# Patient Record
Sex: Male | Born: 1940 | Smoking: Never smoker
Health system: Southern US, Community
[De-identification: ages and names within clinical notes are randomized; demographics above are authoritative.]

## PROBLEM LIST (undated history)

## (undated) DIAGNOSIS — M1990 Unspecified osteoarthritis, unspecified site: Secondary | ICD-10-CM

## (undated) DIAGNOSIS — I10 Essential (primary) hypertension: Secondary | ICD-10-CM

## (undated) DIAGNOSIS — I499 Cardiac arrhythmia, unspecified: Secondary | ICD-10-CM

## (undated) HISTORY — PX: THYROID SURGERY: SHX805

## (undated) HISTORY — PX: CHOLECYSTECTOMY: SHX55

## (undated) HISTORY — PX: COLONOSCOPY: SHX174

---

## 2011-06-11 DIAGNOSIS — Z9089 Acquired absence of other organs: Secondary | ICD-10-CM | POA: Insufficient documentation

## 2011-06-11 DIAGNOSIS — N400 Enlarged prostate without lower urinary tract symptoms: Secondary | ICD-10-CM | POA: Insufficient documentation

## 2011-06-11 DIAGNOSIS — E039 Hypothyroidism, unspecified: Secondary | ICD-10-CM | POA: Insufficient documentation

## 2011-06-11 DIAGNOSIS — I10 Essential (primary) hypertension: Secondary | ICD-10-CM | POA: Insufficient documentation

## 2013-10-01 DIAGNOSIS — I509 Heart failure, unspecified: Secondary | ICD-10-CM | POA: Insufficient documentation

## 2018-05-29 DIAGNOSIS — R29898 Other symptoms and signs involving the musculoskeletal system: Secondary | ICD-10-CM | POA: Insufficient documentation

## 2019-06-18 DIAGNOSIS — F5104 Psychophysiologic insomnia: Secondary | ICD-10-CM | POA: Insufficient documentation

## 2019-09-25 ENCOUNTER — Other Ambulatory Visit: Payer: Self-pay | Admitting: Orthopedic Surgery

## 2019-10-11 NOTE — Patient Instructions (Addendum)
DUE TO COVID-19 ONLY ONE VISITOR IS ALLOWED TO COME WITH YOU AND STAY IN THE WAITING ROOM ONLY DURING PRE OP AND PROCEDURE DAY OF SURGERY. THE 1 VISITOR MAY VISIT WITH YOU AFTER SURGERY IN YOUR PRIVATE ROOM DURING VISITING HOURS ONLY!  YOU NEED TO HAVE A COVID 19 TEST ON: 10/18/19 @  10:45 am , THIS TEST MUST BE DONE BEFORE SURGERY, COME  [address], [place] , [address].  [facility]) ONCE YOUR COVID TEST IS COMPLETED, PLEASE BEGIN THE QUARANTINE INSTRUCTIONS AS OUTLINED IN YOUR HANDOUT.                Dustin Washington     Your procedure is scheduled on: 10/22/19   Report to [facility] Main  Entrance   Report to SHORT STAY at: 5:30 AM     Call this number if you have problems the morning of surgery [phone]    Remember:    BRUSH YOUR TEETH MORNING OF SURGERY AND RINSE YOUR MOUTH OUT, NO CHEWING GUM CANDY OR MINTS.     Take these medicines the morning of surgery with A SIP OF WATER: amlodipine,levothiroxine.Eye drops as needed.                                 You may not have any metal on your body including hair pins and              piercings  Do not wear jewelry, lotions, powders or perfumes, deodorant            Men may shave face and neck.   Do not bring valuables to the hospital. [name] IS NOT             RESPONSIBLE   FOR VALUABLES.  Contacts, dentures or bridgework may not be worn into surgery.  Leave suitcase in the car. After surgery it may be brought to your room.     Patients discharged the day of surgery will not be allowed to drive home. IF YOU ARE HAVING SURGERY AND GOING HOME THE SAME DAY, YOU MUST HAVE AN ADULT TO DRIVE YOU HOME AND BE WITH YOU FOR 24 HOURS. YOU MAY GO HOME BY TAXI OR UBER OR ORTHERWISE, BUT AN ADULT MUST ACCOMPANY YOU HOME AND STAY WITH YOU FOR 24 HOURS.  Name and phone number of your driver:  Special Instructions: N/A              Please read over the following fact sheets you were  given: _____________________________________________________________________             NO SOLID FOOD AFTER MIDNIGHT THE NIGHT PRIOR TO SURGERY. NOTHING BY MOUTH EXCEPT CLEAR LIQUIDS UNTIL: 4:15 am . PLEASE FINISH ENSURE DRINK PER SURGEON ORDER  WHICH NEEDS TO BE COMPLETED AT: 4:15 am .   CLEAR LIQUID DIET   Foods Allowed                                                                     Foods Excluded  Coffee and tea, regular and decaf  liquids that you cannot  Plain Jell-O any favor except red or purple                                           see through such as: Fruit ices (not with fruit pulp)                                     milk, soups, orange juice  Iced Popsicles                                    All solid food Carbonated beverages, regular and diet                                    Cranberry, grape and apple juices Sports drinks like Gatorade Lightly seasoned clear broth or consume(fat free) Sugar, honey syrup  Sample Menu Breakfast                                Lunch                                     Supper Cranberry juice                    Beef broth                            Chicken broth Jell-O                                     Grape juice                           Apple juice Coffee or tea                        Jell-O                                      Popsicle                                                Coffee or tea                        Coffee or tea  _____________________________________________________________________  [facility] - Preparing for Surgery Before surgery, you can play an important role.  Because skin is not sterile, your skin needs to be as free of germs as possible.  You can reduce the number of germs on your skin by washing with CHG (chlorahexidine gluconate) soap before surgery.  CHG is an antiseptic cleaner which kills  germs and bonds with the skin to continue killing germs even after  washing. Please DO NOT use if you have an allergy to CHG or antibacterial soaps.  If your skin becomes reddened/irritated stop using the CHG and inform your nurse when you arrive at Short Stay. Do not shave (including legs and underarms) for at least 48 hours prior to the first CHG shower.  You may shave your face/neck. Please follow these instructions carefully:  1.  Shower with CHG Soap the night before surgery and the  morning of Surgery.  2.  If you choose to wash your hair, wash your hair first as usual with your  normal  shampoo.  3.  After you shampoo, rinse your hair and body thoroughly to remove the  shampoo.                           4.  Use CHG as you would any other liquid soap.  You can apply chg directly  to the skin and wash                       Gently with a scrungie or clean washcloth.  5.  Apply the CHG Soap to your body ONLY FROM THE NECK DOWN.   Do not use on face/ open                           Wound or open sores. Avoid contact with eyes, ears mouth and genitals (private parts).                       Wash face,  Genitals (private parts) with your normal soap.             6.  Wash thoroughly, paying special attention to the area where your surgery  will be performed.  7.  Thoroughly rinse your body with warm water from the neck down.  8.  DO NOT shower/wash with your normal soap after using and rinsing off  the CHG Soap.                9.  Pat yourself dry with a clean towel.            10.  Wear clean pajamas.            11.  Place clean sheets on your bed the night of your first shower and do not  sleep with pets. Day of Surgery : Do not apply any lotions/deodorants the morning of surgery.  Please wear clean clothes to the hospital/surgery center.  FAILURE TO FOLLOW THESE INSTRUCTIONS MAY RESULT IN THE CANCELLATION OF YOUR SURGERY PATIENT SIGNATURE_________________________________  NURSE  SIGNATURE__________________________________  ________________________________________________________________________   Dustin Washington  An incentive spirometer is a tool that can help keep your lungs clear and active. This tool measures how well you are filling your lungs with each breath. Taking long deep breaths may help reverse or decrease the chance of developing breathing (pulmonary) problems (especially infection) following:  A long period of time when you are unable to move or be active. BEFORE THE PROCEDURE   If the spirometer includes an indicator to show your best effort, your nurse or respiratory therapist will set it to a desired goal.  If possible, sit up straight or lean slightly forward. Try not to slouch.  Hold the incentive spirometer in an  upright position. INSTRUCTIONS FOR USE  1. Sit on the edge of your bed if possible, or sit up as far as you can in bed or on a chair. 2. Hold the incentive spirometer in an upright position. 3. Breathe out normally. 4. Place the mouthpiece in your mouth and seal your lips tightly around it. 5. Breathe in slowly and as deeply as possible, raising the piston or the ball toward the top of the column. 6. Hold your breath for 3-5 seconds or for as long as possible. Allow the piston or ball to fall to the bottom of the column. 7. Remove the mouthpiece from your mouth and breathe out normally. 8. Rest for a few seconds and repeat Steps 1 through 7 at least 10 times every 1-2 hours when you are awake. Take your time and take a few normal breaths between deep breaths. 9. The spirometer may include an indicator to show your best effort. Use the indicator as a goal to work toward during each repetition. 10. After each set of 10 deep breaths, practice coughing to be sure your lungs are clear. If you have an incision (the cut made at the time of surgery), support your incision when coughing by placing a pillow or rolled up towels firmly  against it. Once you are able to get out of bed, walk around indoors and cough well. You may stop using the incentive spirometer when instructed by your caregiver.  RISKS AND COMPLICATIONS  Take your time so you do not get dizzy or light-headed.  If you are in pain, you may need to take or ask for pain medication before doing incentive spirometry. It is harder to take a deep breath if you are having pain. AFTER USE  Rest and breathe slowly and easily.  It can be helpful to keep track of a log of your progress. Your caregiver can provide you with a simple table to help with this. If you are using the spirometer at home, follow these instructions: [place] IF:   You are having difficultly using the spirometer.  You have trouble using the spirometer as often as instructed.  Your pain medication is not giving enough relief while using the spirometer.  You develop fever of 100.5 F (38.1 C) or higher. SEEK IMMEDIATE MEDICAL CARE IF:   You cough up bloody sputum that had not been present before.  You develop fever of 102 F (38.9 C) or greater.  You develop worsening pain at or near the incision site. MAKE SURE YOU:   Understand these instructions.  Will watch your condition.  Will get help right away if you are not doing well or get worse. Document Released: 11/29/2006 Document Revised: 10/11/2011 Document Reviewed: 01/30/2007 [facility] Patient Information 2014 [place], [place].   ________________________________________________________________________

## 2019-10-12 ENCOUNTER — Other Ambulatory Visit: Payer: Self-pay

## 2019-10-12 ENCOUNTER — Encounter (HOSPITAL_COMMUNITY)
Admission: RE | Admit: 2019-10-12 | Discharge: 2019-10-12 | Disposition: A | Discharge status: Home / Self Care | Payer: Worker's Compensation | Source: Ambulatory Visit | Attending: Orthopedic Surgery | Admitting: Orthopedic Surgery

## 2019-10-12 ENCOUNTER — Encounter (HOSPITAL_COMMUNITY): Payer: Self-pay

## 2019-10-12 DIAGNOSIS — Z01812 Encounter for preprocedural laboratory examination: Secondary | ICD-10-CM | POA: Insufficient documentation

## 2019-10-12 HISTORY — DX: Essential (primary) hypertension: I10

## 2019-10-12 HISTORY — DX: Unspecified osteoarthritis, unspecified site: M19.90

## 2019-10-12 HISTORY — DX: Cardiac arrhythmia, unspecified: I49.9

## 2019-10-12 NOTE — Progress Notes (Signed)
PCP -  Cardiologist - Dr. Patty Sermons : [place] [facility] [address]  Chest x-ray -  EKG -  Stress Test -  ECHO -  Cardiac Cath -   Sleep Study -  CPAP -   Fasting Blood Sugar -  Checks Blood Sugar _____ times a day  Blood Thinner Instructions: Aspirin Instructions:No instructions,RN advised pt. To call MD. For instructions. Last Dose:  Anesthesia review:   Patient denies shortness of breath, fever, cough and chest pain at PAT appointment   Patient verbalized understanding of instructions that were given to them at the PAT appointment. Patient was also instructed that they will need to review over the PAT instructions again at home before surgery.

## 2019-10-18 ENCOUNTER — Encounter (HOSPITAL_COMMUNITY)
Admission: RE | Admit: 2019-10-18 | Discharge: 2019-10-18 | Disposition: A | Discharge status: Home / Self Care | Payer: Worker's Compensation | Source: Ambulatory Visit | Attending: Orthopedic Surgery | Admitting: Orthopedic Surgery

## 2019-10-18 ENCOUNTER — Ambulatory Visit (HOSPITAL_COMMUNITY)
Admission: RE | Admit: 2019-10-18 | Discharge: 2019-10-18 | Disposition: A | Discharge status: Home / Self Care | Payer: Medicare Other | Source: Ambulatory Visit | Attending: Orthopedic Surgery | Admitting: Orthopedic Surgery

## 2019-10-18 ENCOUNTER — Other Ambulatory Visit (HOSPITAL_COMMUNITY)
Admission: RE | Admit: 2019-10-18 | Discharge: 2019-10-18 | Disposition: A | Discharge status: Home / Self Care | Payer: Medicare Other | Source: Ambulatory Visit | Attending: Orthopedic Surgery | Admitting: Orthopedic Surgery

## 2019-10-18 ENCOUNTER — Other Ambulatory Visit: Payer: Self-pay

## 2019-10-18 DIAGNOSIS — Z01818 Encounter for other preprocedural examination: Secondary | ICD-10-CM | POA: Insufficient documentation

## 2019-10-18 DIAGNOSIS — R9431 Abnormal electrocardiogram [ECG] [EKG]: Secondary | ICD-10-CM | POA: Diagnosis not present

## 2019-10-18 DIAGNOSIS — Z20822 Contact with and (suspected) exposure to covid-19: Secondary | ICD-10-CM | POA: Insufficient documentation

## 2019-10-18 DIAGNOSIS — I517 Cardiomegaly: Secondary | ICD-10-CM | POA: Diagnosis not present

## 2019-10-18 DIAGNOSIS — I10 Essential (primary) hypertension: Secondary | ICD-10-CM | POA: Diagnosis not present

## 2019-10-18 LAB — BASIC METABOLIC PANEL
Anion gap: 8 (ref 5–15)
BUN: 11 mg/dL (ref 8–23)
CO2: 28 mmol/L (ref 22–32)
Calcium: 9.7 mg/dL (ref 8.9–10.3)
Chloride: 101 mmol/L (ref 98–111)
Creatinine, Ser: 0.97 mg/dL (ref 0.61–1.24)
GFR calc Af Amer: >60 mL/min (ref >60)
GFR calc non Af Amer: >60 mL/min (ref >60)
Glucose, Bld: 111 mg/dL — ABNORMAL HIGH (ref 70–99)
Potassium: 4 mmol/L (ref 3.5–5.1)
Sodium: 137 mmol/L (ref 135–145)

## 2019-10-18 LAB — URINALYSIS, ROUTINE W REFLEX MICROSCOPIC
Bilirubin Urine: NEGATIVE
Glucose, UA: NEGATIVE mg/dL
Hgb urine dipstick: NEGATIVE
Ketones, ur: NEGATIVE mg/dL
Leukocytes,Ua: NEGATIVE
Nitrite: NEGATIVE
Protein, ur: NEGATIVE mg/dL
Specific Gravity, Urine: 1.009 (ref 1.005–1.030)
pH: 6 (ref 5.0–8.0)

## 2019-10-18 LAB — CBC WITH DIFFERENTIAL/PLATELET
Abs Immature Granulocytes: 0.02 10*3/uL (ref 0.00–0.07)
Basophils Absolute: 0 10*3/uL (ref 0.0–0.1)
Basophils Relative: 0 %
Eosinophils Absolute: 0 10*3/uL (ref 0.0–0.5)
Eosinophils Relative: 0 %
HCT: 41.1 % (ref 39.0–52.0)
Hemoglobin: 13.4 g/dL (ref 13.0–17.0)
Immature Granulocytes: 0 %
Lymphocytes Relative: 23 %
Lymphs Abs: 1.3 10*3/uL (ref 0.7–4.0)
MCH: 31.6 pg (ref 26.0–34.0)
MCHC: 32.6 g/dL (ref 30.0–36.0)
MCV: 96.9 fL (ref 80.0–100.0)
Monocytes Absolute: 0.4 10*3/uL (ref 0.1–1.0)
Monocytes Relative: 8 %
Neutro Abs: 3.9 10*3/uL (ref 1.7–7.7)
Neutrophils Relative %: 69 %
Platelets: 190 10*3/uL (ref 150–400)
RBC: 4.24 MIL/uL (ref 4.22–5.81)
RDW: 13.2 % (ref 11.5–15.5)
WBC: 5.7 10*3/uL (ref 4.0–10.5)
nRBC: 0 % (ref 0.0–0.2)

## 2019-10-18 LAB — SURGICAL PCR SCREEN
MRSA, PCR: NEGATIVE
Staphylococcus aureus: NEGATIVE

## 2019-10-18 LAB — PROTIME-INR
INR: 1 (ref 0.8–1.2)
Prothrombin Time: 13.1 seconds (ref 11.4–15.2)

## 2019-10-18 LAB — SARS CORONAVIRUS 2 (TAT 6-24 HRS): SARS Coronavirus 2: NEGATIVE

## 2019-10-18 LAB — APTT: aPTT: 27 seconds (ref 24–36)

## 2019-10-18 LAB — ABO/RH: ABO/RH(D): O POS

## 2019-10-19 ENCOUNTER — Encounter (HOSPITAL_COMMUNITY): Payer: Self-pay | Admitting: Orthopedic Surgery

## 2019-10-19 DIAGNOSIS — M1712 Unilateral primary osteoarthritis, left knee: Secondary | ICD-10-CM | POA: Diagnosis present

## 2019-10-19 NOTE — H&P (Signed)
TOTAL KNEE ADMISSION H&P  Patient is being admitted for left total knee arthroplasty.  Subjective:  Chief Complaint:left knee pain.  HPI: Dustin Washington, 79 y.o. male, has a history of pain and functional disability in the left knee due to trauma and arthritis and has failed non-surgical conservative treatments for greater than 12 weeks to includecorticosteriod injections, flexibility and strengthening excercises and activity modification.  Onset of symptoms was gradual, starting 2 years ago with gradually worsening course since that time. The patient noted no past surgery on the left knee(s).  Patient currently rates pain in the left knee(s) at 10 out of 10 with activity. Patient has night pain, worsening of pain with activity and weight bearing, pain that interferes with activities of daily living, pain with passive range of motion and crepitus.  Patient has evidence of periarticular osteophytes and joint space narrowing by imaging studies.  There is no active infection.  There are no problems to display for this patient.  Past Medical History:  Diagnosis Date  . Arthritis   . Dysrhythmia    irregular heart beat  . Hypertension     Past Surgical History:  Procedure Laterality Date  . CHOLECYSTECTOMY    . COLONOSCOPY    . THYROID SURGERY      No current facility-administered medications for this encounter.   Current Outpatient Medications  Medication Sig Dispense Refill Last Dose  . acetaminophen (TYLENOL) 500 MG tablet Take 500-1,000 mg by mouth every 6 (six) hours as needed (for pain.).     Marland Kitchen amLODipine (NORVASC) 5 MG tablet Take 5 mg by mouth daily.     Marland Kitchen aspirin EC 81 MG tablet Take 81 mg by mouth daily.     Marland Kitchen atorvastatin (LIPITOR) 80 MG tablet Take 80 mg by mouth every evening.     . Buprenorphine 7.5 MCG/HR PTWK Place 1 patch onto the skin See admin instructions. Every 7 days if needed for pain.     . calcium carbonate (OSCAL) 1500 (600 Ca) MG TABS tablet Take 600 mg by  mouth daily.     Marland Kitchen Cod Liver Oil (COD LIVER PO) Take 1 capsule by mouth in the morning and at bedtime.     . furosemide (LASIX) 20 MG tablet Take 20 mg by mouth daily.     . Gabapentin, Once-Daily, (GRALISE) 600 MG TABS Take 600 mg by mouth in the morning and at bedtime.     Marland Kitchen levothyroxine (SYNTHROID) 100 MCG tablet Take 100 mcg by mouth daily before breakfast.     . lisinopril (ZESTRIL) 20 MG tablet Take 20 mg by mouth daily.     . Multiple Vitamin (MULTIVITAMIN WITH MINERALS) TABS tablet Take 1 tablet by mouth daily. Centrum Silver     . naphazoline-pheniramine (NAPHCON-A) 0.025-0.3 % ophthalmic solution Place 1 drop into both eyes 2 (two) times daily as needed for eye irritation or allergies.      Allergies  Allergen Reactions  . Codeine Other (See Comments)    Mental changes/"felt as if bug were crawling all over body"  . Penicillins Itching and Rash    Did it involve swelling of the face/tongue/throat, SOB, or low BP? Unknown Did it involve sudden or severe rash/hives, skin peeling, or any reaction on the inside of your mouth or nose? Unknown Did you need to seek medical attention at a hospital or doctor's office? No When did it last happen?More than 25-30 years ago If all above answers are "NO", may proceed with cephalosporin use.  Social History   Tobacco Use  . Smoking status: Never Smoker  . Smokeless tobacco: Never Used  Substance Use Topics  . Alcohol use: Never    No family history on file.   Review of Systems  Constitutional: Negative.   HENT: Negative.   Eyes: Negative.   Respiratory: Negative.   Cardiovascular: Positive for leg swelling.       Irregular heartbeat  Gastrointestinal: Negative.   Endocrine: Negative.   Genitourinary: Negative.   Musculoskeletal: Positive for arthralgias and myalgias.  Skin: Negative.   Allergic/Immunologic: Negative.   Neurological: Negative.   Hematological: Negative.   Psychiatric/Behavioral: Negative.      Objective:  Physical Exam  Constitutional: He is oriented to person, place, and time. He appears well-developed and well-nourished.  HENT:  Head: Normocephalic and atraumatic.  Eyes: Pupils are equal, round, and reactive to light.  Cardiovascular: Intact distal pulses.  Respiratory: Effort normal.  Musculoskeletal:        General: Tenderness present.     Cervical back: Neck supple.     Comments: Tender along the medial joint line of the left knee, range of motion 3/125, varus stress exacerbates his pain, valgus does not, trace effusion good, quadriceps and hamstring power, he does walk with a fairly impressive left-sided limp, he is neurovascular intact distally.  Neurological: He is alert and oriented to person, place, and time.  Skin: Skin is warm and dry.  Psychiatric: He has a normal mood and affect. His behavior is normal. Judgment and thought content normal.    Vital signs in last 24 hours:    Labs:   Estimated body mass index is 34.92 kg/m as calculated from the following:   Height as of 10/18/19: 5\' 9"  (1.753 m).   Weight as of 10/18/19: 107.3 kg.   Imaging Review Plain radiographs demonstrate  bilateral AP weightbearing, bilateral Rosenberg, lateral and sunrise views of the left knee are taken and reviewed in office today.  This does show end-stage arthritis medial compartment left knee and end-stage arthritis bone-on-bone of the right knee   Assessment/Plan:  End stage arthritis, left knee   The patient history, physical examination, clinical judgment of the provider and imaging studies are consistent with end stage degenerative joint disease of the left knee(s) and total knee arthroplasty is deemed medically necessary. The treatment options including medical management, injection therapy arthroscopy and arthroplasty were discussed at length. The risks and benefits of total knee arthroplasty were presented and reviewed. The risks due to aseptic loosening,  infection, stiffness, patella tracking problems, thromboembolic complications and other imponderables were discussed. The patient acknowledged the explanation, agreed to proceed with the plan and consent was signed. Patient is being admitted for inpatient treatment for surgery, pain control, PT, OT, prophylactic antibiotics, VTE prophylaxis, progressive ambulation and ADL's and discharge planning. The patient is planning to be discharged home with home health services     Patient's anticipated LOS is less than 2 midnights, meeting these requirements: - Younger than 64 - Lives within 1 hour of care - Has a competent adult at home to recover with post-op recover - NO history of  - Chronic pain requiring opiods  - Diabetes  - Coronary Artery Disease  - Heart attack  - Stroke  - DVT/VTE  - Cardiac arrhythmia  - Respiratory Failure/COPD  - Renal failure  - Anemia  - Advanced Liver disease

## 2019-10-21 MED ORDER — BUPIVACAINE LIPOSOME 1.3 % IJ SUSP
20.0000 mL | INTRAMUSCULAR | Status: AC
  Filled 2019-10-21: qty 20

## 2019-10-21 MED ORDER — TRANEXAMIC ACID 1000 MG/10ML IV SOLN
2000.0000 mg | INTRAVENOUS | Status: AC
  Filled 2019-10-21: qty 20

## 2019-10-22 ENCOUNTER — Ambulatory Visit (HOSPITAL_COMMUNITY): Payer: Worker's Compensation | Admitting: Anesthesiology

## 2019-10-22 ENCOUNTER — Other Ambulatory Visit: Payer: Self-pay

## 2019-10-22 ENCOUNTER — Ambulatory Visit (HOSPITAL_COMMUNITY)
Admission: RE | Admit: 2019-10-22 | Discharge: 2019-10-23 | Disposition: A | Discharge status: Home / Self Care | Payer: Worker's Compensation | Source: Ambulatory Visit | Attending: Orthopedic Surgery | Admitting: Orthopedic Surgery

## 2019-10-22 ENCOUNTER — Encounter (HOSPITAL_COMMUNITY): Payer: Self-pay | Admitting: Orthopedic Surgery

## 2019-10-22 ENCOUNTER — Encounter (HOSPITAL_COMMUNITY)
Admission: RE | Disposition: A | Discharge status: Home / Self Care | Payer: Self-pay | Source: Ambulatory Visit | Attending: Orthopedic Surgery

## 2019-10-22 DIAGNOSIS — I10 Essential (primary) hypertension: Secondary | ICD-10-CM | POA: Insufficient documentation

## 2019-10-22 DIAGNOSIS — Z7982 Long term (current) use of aspirin: Secondary | ICD-10-CM | POA: Diagnosis not present

## 2019-10-22 DIAGNOSIS — Z79899 Other long term (current) drug therapy: Secondary | ICD-10-CM | POA: Diagnosis not present

## 2019-10-22 DIAGNOSIS — M1712 Unilateral primary osteoarthritis, left knee: Secondary | ICD-10-CM | POA: Diagnosis present

## 2019-10-22 DIAGNOSIS — Z885 Allergy status to narcotic agent status: Secondary | ICD-10-CM | POA: Diagnosis not present

## 2019-10-22 DIAGNOSIS — Z96652 Presence of left artificial knee joint: Secondary | ICD-10-CM

## 2019-10-22 DIAGNOSIS — Z880 Allergy status to penicillin: Secondary | ICD-10-CM | POA: Diagnosis not present

## 2019-10-22 HISTORY — PX: TOTAL KNEE ARTHROPLASTY: SHX125

## 2019-10-22 LAB — GLUCOSE, CAPILLARY: Glucose-Capillary: 159 mg/dL — ABNORMAL HIGH (ref 70–99)

## 2019-10-22 LAB — TYPE AND SCREEN
ABO/RH(D): O POS
Antibody Screen: NEGATIVE

## 2019-10-22 SURGERY — ARTHROPLASTY, KNEE, TOTAL
Anesthesia: Spinal | Site: Knee | Laterality: Left

## 2019-10-22 MED ORDER — MIDAZOLAM HCL 2 MG/2ML IJ SOLN
INTRAMUSCULAR | Status: AC
  Filled 2019-10-22: qty 2

## 2019-10-22 MED ORDER — ONDANSETRON HCL 4 MG PO TABS: 4.0000 mg | ORAL_TABLET | Freq: Four times a day (QID) | ORAL | Status: DC | PRN

## 2019-10-22 MED ORDER — BUPIVACAINE IN DEXTROSE 0.75-8.25 % IT SOLN
INTRATHECAL | Status: DC | PRN
  Administered 2019-10-22: 1.7 mL via INTRATHECAL

## 2019-10-22 MED ORDER — GLYCOPYRROLATE PF 0.2 MG/ML IJ SOSY
PREFILLED_SYRINGE | INTRAMUSCULAR | Status: DC | PRN
  Administered 2019-10-22: .2 mg via INTRAVENOUS

## 2019-10-22 MED ORDER — ALUM & MAG HYDROXIDE-SIMETH 200-200-20 MG/5ML PO SUSP: 30.0000 mL | ORAL | Status: DC | PRN

## 2019-10-22 MED ORDER — ONDANSETRON HCL 4 MG/2ML IJ SOLN
INTRAMUSCULAR | Status: AC
  Filled 2019-10-22: qty 6

## 2019-10-22 MED ORDER — HYDROMORPHONE HCL 2 MG PO TABS: 2.0000 mg | ORAL_TABLET | ORAL | 0 refills | Status: AC | PRN

## 2019-10-22 MED ORDER — FENTANYL CITRATE (PF) 100 MCG/2ML IJ SOLN
INTRAMUSCULAR | Status: DC | PRN
  Administered 2019-10-22: 50 ug via INTRAVENOUS

## 2019-10-22 MED ORDER — LEVOTHYROXINE SODIUM 100 MCG PO TABS
100.0000 ug | ORAL_TABLET | Freq: Every day | ORAL | Status: DC
  Administered 2019-10-23: 100 ug via ORAL
  Filled 2019-10-22: qty 1

## 2019-10-22 MED ORDER — TRANEXAMIC ACID-NACL 1000-0.7 MG/100ML-% IV SOLN
1000.0000 mg | INTRAVENOUS | Status: AC
  Administered 2019-10-22: 1000 mg via INTRAVENOUS

## 2019-10-22 MED ORDER — PROPOFOL 1000 MG/100ML IV EMUL
INTRAVENOUS | Status: AC
  Filled 2019-10-22: qty 100

## 2019-10-22 MED ORDER — TIZANIDINE HCL 2 MG PO TABS: 2.0000 mg | ORAL_TABLET | Freq: Four times a day (QID) | ORAL | 0 refills | Status: AC | PRN

## 2019-10-22 MED ORDER — NAPHAZOLINE-PHENIRAMINE 0.025-0.3 % OP SOLN: 1.0000 [drp] | Freq: Two times a day (BID) | OPHTHALMIC | Status: DC | PRN

## 2019-10-22 MED ORDER — METHOCARBAMOL 500 MG PO TABS
500.0000 mg | ORAL_TABLET | Freq: Four times a day (QID) | ORAL | Status: DC | PRN
  Administered 2019-10-22 – 2019-10-23 (×2): 500 mg via ORAL
  Filled 2019-10-22 (×2): qty 1

## 2019-10-22 MED ORDER — OXYCODONE HCL 5 MG PO TABS: 5.0000 mg | ORAL_TABLET | Freq: Once | ORAL | Status: DC | PRN

## 2019-10-22 MED ORDER — FUROSEMIDE 20 MG PO TABS
20.0000 mg | ORAL_TABLET | Freq: Every day | ORAL | Status: DC
  Administered 2019-10-23: 20 mg via ORAL
  Filled 2019-10-22 (×2): qty 1

## 2019-10-22 MED ORDER — ONDANSETRON HCL 4 MG/2ML IJ SOLN: 4.0000 mg | Freq: Four times a day (QID) | INTRAMUSCULAR | Status: DC | PRN

## 2019-10-22 MED ORDER — LACTATED RINGERS IV SOLN: INTRAVENOUS | Status: DC

## 2019-10-22 MED ORDER — PHENYLEPHRINE 40 MCG/ML (10ML) SYRINGE FOR IV PUSH (FOR BLOOD PRESSURE SUPPORT)
PREFILLED_SYRINGE | INTRAVENOUS | Status: AC
  Filled 2019-10-22: qty 10

## 2019-10-22 MED ORDER — WATER FOR IRRIGATION, STERILE IR SOLN
Status: DC | PRN
  Administered 2019-10-22: 1000 mL

## 2019-10-22 MED ORDER — PROPOFOL 10 MG/ML IV BOLUS
INTRAVENOUS | Status: AC
  Filled 2019-10-22: qty 40

## 2019-10-22 MED ORDER — HYDROMORPHONE HCL 1 MG/ML IJ SOLN: 0.5000 mg | INTRAMUSCULAR | Status: DC | PRN

## 2019-10-22 MED ORDER — GABAPENTIN 300 MG PO CAPS
300.0000 mg | ORAL_CAPSULE | Freq: Two times a day (BID) | ORAL | Status: DC
  Administered 2019-10-22 – 2019-10-23 (×2): 300 mg via ORAL
  Filled 2019-10-22 (×2): qty 1

## 2019-10-22 MED ORDER — PHENYLEPHRINE HCL-NACL 10-0.9 MG/250ML-% IV SOLN
INTRAVENOUS | Status: AC
  Filled 2019-10-22: qty 750

## 2019-10-22 MED ORDER — BUPIVACAINE-EPINEPHRINE 0.5% -1:200000 IJ SOLN
INTRAMUSCULAR | Status: AC
  Filled 2019-10-22: qty 1

## 2019-10-22 MED ORDER — LISINOPRIL 20 MG PO TABS
20.0000 mg | ORAL_TABLET | Freq: Every day | ORAL | Status: DC
  Administered 2019-10-23: 20 mg via ORAL
  Filled 2019-10-22 (×2): qty 1

## 2019-10-22 MED ORDER — ACETAMINOPHEN 325 MG PO TABS: 325.0000 mg | ORAL_TABLET | Freq: Once | ORAL | Status: DC | PRN

## 2019-10-22 MED ORDER — ASPIRIN EC 81 MG PO TBEC: 81.0000 mg | DELAYED_RELEASE_TABLET | Freq: Two times a day (BID) | ORAL | 0 refills | Status: AC

## 2019-10-22 MED ORDER — LIDOCAINE 2% (20 MG/ML) 5 ML SYRINGE
INTRAMUSCULAR | Status: DC | PRN
  Administered 2019-10-22: 60 mg via INTRAVENOUS

## 2019-10-22 MED ORDER — SODIUM CHLORIDE (PF) 0.9 % IJ SOLN
INTRAMUSCULAR | Status: AC
  Filled 2019-10-22: qty 20

## 2019-10-22 MED ORDER — PHENOL 1.4 % MT LIQD: 1.0000 | OROMUCOSAL | Status: DC | PRN

## 2019-10-22 MED ORDER — LIDOCAINE 2% (20 MG/ML) 5 ML SYRINGE
INTRAMUSCULAR | Status: AC
  Filled 2019-10-22: qty 15

## 2019-10-22 MED ORDER — MEPERIDINE HCL 50 MG/ML IJ SOLN: 6.2500 mg | INTRAMUSCULAR | Status: DC | PRN

## 2019-10-22 MED ORDER — EPHEDRINE SULFATE-NACL 50-0.9 MG/10ML-% IV SOSY
PREFILLED_SYRINGE | INTRAVENOUS | Status: DC | PRN
  Administered 2019-10-22: 10 mg via INTRAVENOUS

## 2019-10-22 MED ORDER — TRANEXAMIC ACID-NACL 1000-0.7 MG/100ML-% IV SOLN
INTRAVENOUS | Status: AC
  Filled 2019-10-22: qty 100

## 2019-10-22 MED ORDER — PROMETHAZINE HCL 25 MG/ML IJ SOLN: 6.2500 mg | INTRAMUSCULAR | Status: DC | PRN

## 2019-10-22 MED ORDER — AMLODIPINE BESYLATE 5 MG PO TABS
5.0000 mg | ORAL_TABLET | Freq: Every day | ORAL | Status: DC
  Administered 2019-10-23: 5 mg via ORAL
  Filled 2019-10-22: qty 1

## 2019-10-22 MED ORDER — MIDAZOLAM HCL 5 MG/5ML IJ SOLN
INTRAMUSCULAR | Status: DC | PRN
  Administered 2019-10-22 (×2): 1 mg via INTRAVENOUS

## 2019-10-22 MED ORDER — FLEET ENEMA 7-19 GM/118ML RE ENEM: 1.0000 | ENEMA | Freq: Once | RECTAL | Status: DC | PRN

## 2019-10-22 MED ORDER — ONDANSETRON HCL 4 MG/2ML IJ SOLN
INTRAMUSCULAR | Status: DC | PRN
  Administered 2019-10-22: 4 mg via INTRAVENOUS

## 2019-10-22 MED ORDER — PHENYLEPHRINE HCL-NACL 10-0.9 MG/250ML-% IV SOLN
INTRAVENOUS | Status: DC | PRN
  Administered 2019-10-22: 20 ug/min via INTRAVENOUS

## 2019-10-22 MED ORDER — KCL IN DEXTROSE-NACL 20-5-0.45 MEQ/L-%-% IV SOLN
INTRAVENOUS | Status: DC
  Filled 2019-10-22 (×3): qty 1000

## 2019-10-22 MED ORDER — POLYETHYLENE GLYCOL 3350 17 G PO PACK: 17.0000 g | PACK | Freq: Every day | ORAL | Status: DC | PRN

## 2019-10-22 MED ORDER — ACETAMINOPHEN 10 MG/ML IV SOLN: 1000.0000 mg | Freq: Once | INTRAVENOUS | Status: DC | PRN

## 2019-10-22 MED ORDER — METHOCARBAMOL 500 MG IVPB - SIMPLE MED
500.0000 mg | Freq: Four times a day (QID) | INTRAVENOUS | Status: DC | PRN
  Filled 2019-10-22: qty 50

## 2019-10-22 MED ORDER — SODIUM CHLORIDE 0.9 % IR SOLN
Status: DC | PRN
  Administered 2019-10-22: 1000 mL

## 2019-10-22 MED ORDER — CEFAZOLIN SODIUM-DEXTROSE 2-4 GM/100ML-% IV SOLN
INTRAVENOUS | Status: AC
  Filled 2019-10-22: qty 100

## 2019-10-22 MED ORDER — PANTOPRAZOLE SODIUM 40 MG PO TBEC
40.0000 mg | DELAYED_RELEASE_TABLET | Freq: Every day | ORAL | Status: DC
  Administered 2019-10-23: 40 mg via ORAL
  Filled 2019-10-22: qty 1

## 2019-10-22 MED ORDER — MENTHOL 3 MG MT LOZG: 1.0000 | LOZENGE | OROMUCOSAL | Status: DC | PRN

## 2019-10-22 MED ORDER — ROPIVACAINE HCL 7.5 MG/ML IJ SOLN
INTRAMUSCULAR | Status: DC | PRN
  Administered 2019-10-22: 20 mL via PERINEURAL

## 2019-10-22 MED ORDER — CELECOXIB 200 MG PO CAPS
200.0000 mg | ORAL_CAPSULE | Freq: Two times a day (BID) | ORAL | Status: DC
  Administered 2019-10-22 – 2019-10-23 (×2): 200 mg via ORAL
  Filled 2019-10-22 (×2): qty 1

## 2019-10-22 MED ORDER — PROPOFOL 500 MG/50ML IV EMUL
INTRAVENOUS | Status: DC | PRN
  Administered 2019-10-22: 75 ug/kg/min via INTRAVENOUS
  Administered 2019-10-22: 10 mg via INTRAVENOUS

## 2019-10-22 MED ORDER — DEXAMETHASONE SODIUM PHOSPHATE 10 MG/ML IJ SOLN
INTRAMUSCULAR | Status: AC
  Filled 2019-10-22: qty 3

## 2019-10-22 MED ORDER — HYDROMORPHONE HCL 1 MG/ML IJ SOLN: 0.2500 mg | INTRAMUSCULAR | Status: DC | PRN

## 2019-10-22 MED ORDER — DOCUSATE SODIUM 100 MG PO CAPS
100.0000 mg | ORAL_CAPSULE | Freq: Two times a day (BID) | ORAL | Status: DC
  Administered 2019-10-22 – 2019-10-23 (×2): 100 mg via ORAL
  Filled 2019-10-22 (×2): qty 1

## 2019-10-22 MED ORDER — GABAPENTIN (ONCE-DAILY) 600 MG PO TABS: 600.0000 mg | ORAL_TABLET | Freq: Every day | ORAL | Status: DC

## 2019-10-22 MED ORDER — PHENYLEPHRINE 40 MCG/ML (10ML) SYRINGE FOR IV PUSH (FOR BLOOD PRESSURE SUPPORT)
PREFILLED_SYRINGE | INTRAVENOUS | Status: DC | PRN
  Administered 2019-10-22 (×3): 40 ug via INTRAVENOUS
  Administered 2019-10-22: 80 ug via INTRAVENOUS
  Administered 2019-10-22: 40 ug via INTRAVENOUS
  Administered 2019-10-22: 120 ug via INTRAVENOUS
  Administered 2019-10-22: 40 ug via INTRAVENOUS

## 2019-10-22 MED ORDER — DEXAMETHASONE SODIUM PHOSPHATE 10 MG/ML IJ SOLN
10.0000 mg | Freq: Once | INTRAMUSCULAR | Status: AC
  Administered 2019-10-23: 10 mg via INTRAVENOUS
  Filled 2019-10-22: qty 1

## 2019-10-22 MED ORDER — BUPIVACAINE HCL (PF) 0.25 % IJ SOLN
INTRAMUSCULAR | Status: AC
  Filled 2019-10-22: qty 30

## 2019-10-22 MED ORDER — SODIUM CHLORIDE (PF) 0.9 % IJ SOLN
INTRAMUSCULAR | Status: DC | PRN
  Administered 2019-10-22: 70 mL

## 2019-10-22 MED ORDER — ACETAMINOPHEN 325 MG PO TABS: 325.0000 mg | ORAL_TABLET | Freq: Four times a day (QID) | ORAL | Status: DC | PRN

## 2019-10-22 MED ORDER — METOCLOPRAMIDE HCL 5 MG/ML IJ SOLN: 5.0000 mg | Freq: Three times a day (TID) | INTRAMUSCULAR | Status: DC | PRN

## 2019-10-22 MED ORDER — BISACODYL 5 MG PO TBEC: 5.0000 mg | DELAYED_RELEASE_TABLET | Freq: Every day | ORAL | Status: DC | PRN

## 2019-10-22 MED ORDER — TRANEXAMIC ACID-NACL 1000-0.7 MG/100ML-% IV SOLN
1000.0000 mg | Freq: Once | INTRAVENOUS | Status: AC
  Administered 2019-10-22: 1000 mg via INTRAVENOUS
  Filled 2019-10-22: qty 100

## 2019-10-22 MED ORDER — ACETAMINOPHEN 500 MG PO TABS
1000.0000 mg | ORAL_TABLET | Freq: Four times a day (QID) | ORAL | Status: AC
  Administered 2019-10-22 – 2019-10-23 (×3): 1000 mg via ORAL
  Filled 2019-10-22 (×4): qty 2

## 2019-10-22 MED ORDER — SODIUM CHLORIDE (PF) 0.9 % IJ SOLN
INTRAMUSCULAR | Status: AC
  Filled 2019-10-22: qty 50

## 2019-10-22 MED ORDER — DEXAMETHASONE SODIUM PHOSPHATE 10 MG/ML IJ SOLN
INTRAMUSCULAR | Status: DC | PRN
  Administered 2019-10-22: 6 mg via INTRAVENOUS

## 2019-10-22 MED ORDER — BUPIVACAINE-EPINEPHRINE (PF) 0.5% -1:200000 IJ SOLN
INTRAMUSCULAR | Status: DC | PRN
  Administered 2019-10-22: 30 mL via PERINEURAL

## 2019-10-22 MED ORDER — ACETAMINOPHEN 160 MG/5ML PO SOLN: 325.0000 mg | Freq: Once | ORAL | Status: DC | PRN

## 2019-10-22 MED ORDER — OXYCODONE HCL 5 MG/5ML PO SOLN: 5.0000 mg | Freq: Once | ORAL | Status: DC | PRN

## 2019-10-22 MED ORDER — POVIDONE-IODINE 10 % EX SWAB
2.0000 "application " | Freq: Once | CUTANEOUS | Status: AC
  Administered 2019-10-22: 2 via TOPICAL

## 2019-10-22 MED ORDER — CEFAZOLIN SODIUM-DEXTROSE 2-4 GM/100ML-% IV SOLN
2.0000 g | INTRAVENOUS | Status: AC
  Administered 2019-10-22: 2 g via INTRAVENOUS

## 2019-10-22 MED ORDER — HYDROMORPHONE HCL 2 MG PO TABS
2.0000 mg | ORAL_TABLET | ORAL | Status: DC | PRN
  Administered 2019-10-22 – 2019-10-23 (×2): 2 mg via ORAL
  Filled 2019-10-22 (×2): qty 1

## 2019-10-22 MED ORDER — DIPHENHYDRAMINE HCL 12.5 MG/5ML PO ELIX
12.5000 mg | ORAL_SOLUTION | ORAL | Status: DC | PRN
  Administered 2019-10-23: 25 mg via ORAL
  Filled 2019-10-22: qty 10

## 2019-10-22 MED ORDER — BUPIVACAINE LIPOSOME 1.3 % IJ SUSP
INTRAMUSCULAR | Status: DC | PRN
  Administered 2019-10-22: 20 mL

## 2019-10-22 MED ORDER — FENTANYL CITRATE (PF) 100 MCG/2ML IJ SOLN
INTRAMUSCULAR | Status: AC
  Filled 2019-10-22: qty 2

## 2019-10-22 MED ORDER — METOCLOPRAMIDE HCL 5 MG PO TABS: 5.0000 mg | ORAL_TABLET | Freq: Three times a day (TID) | ORAL | Status: DC | PRN

## 2019-10-22 MED ORDER — CHLORHEXIDINE GLUCONATE 4 % EX LIQD: 60.0000 mL | Freq: Once | CUTANEOUS | Status: DC

## 2019-10-22 MED ORDER — ASPIRIN 81 MG PO CHEW
81.0000 mg | CHEWABLE_TABLET | Freq: Two times a day (BID) | ORAL | Status: DC
  Administered 2019-10-22 – 2019-10-23 (×2): 81 mg via ORAL
  Filled 2019-10-22 (×2): qty 1

## 2019-10-22 MED ORDER — GLYCOPYRROLATE PF 0.2 MG/ML IJ SOSY
PREFILLED_SYRINGE | INTRAMUSCULAR | Status: AC
  Filled 2019-10-22: qty 1

## 2019-10-22 SURGICAL SUPPLY — 51 items
ATTUNE MED DOME PAT 38 KNEE (Knees) ×2 IMPLANT
ATTUNE PS FEM LT SZ 8 CEM KNEE (Femur) ×2 IMPLANT
ATTUNE PSRP INSR SZ8 6 KNEE (Insert) ×2 IMPLANT
BAG DECANTER FOR FLEXI CONT (MISCELLANEOUS) ×2 IMPLANT
BAG ZIPLOCK 12X15 (MISCELLANEOUS) ×2 IMPLANT
BASE TIBIAL ATTUNE KNEE SZ9 (Knees) ×1 IMPLANT
BLADE SAG 18X100X1.27 (BLADE) ×2 IMPLANT
BLADE SAW SGTL 11.0X1.19X90.0M (BLADE) ×2 IMPLANT
BLADE SURG SZ10 CARB STEEL (BLADE) ×4 IMPLANT
BNDG ELASTIC 6X10 VLCR STRL LF (GAUZE/BANDAGES/DRESSINGS) ×2 IMPLANT
BNDG ELASTIC 6X15 VLCR STRL LF (GAUZE/BANDAGES/DRESSINGS) ×2 IMPLANT
BOWL SMART MIX CTS (DISPOSABLE) ×2 IMPLANT
CEMENT HV SMART SET (Cement) ×4 IMPLANT
COVER SURGICAL LIGHT HANDLE (MISCELLANEOUS) ×2 IMPLANT
COVER WAND RF STERILE (DRAPES) IMPLANT
CUFF TOURN SGL QUICK 34 (TOURNIQUET CUFF) ×1
CUFF TRNQT CYL 34X4.125X (TOURNIQUET CUFF) ×1 IMPLANT
DECANTER SPIKE VIAL GLASS SM (MISCELLANEOUS) ×6 IMPLANT
DRAPE U-SHAPE 47X51 STRL (DRAPES) ×2 IMPLANT
DRSG AQUACEL AG ADV 3.5X10 (GAUZE/BANDAGES/DRESSINGS) ×2 IMPLANT
DURAPREP 26ML APPLICATOR (WOUND CARE) ×2 IMPLANT
ELECT REM PT RETURN 15FT ADLT (MISCELLANEOUS) ×2 IMPLANT
GLOVE BIO SURGEON STRL SZ7.5 (GLOVE) ×2 IMPLANT
GLOVE BIO SURGEON STRL SZ8.5 (GLOVE) ×2 IMPLANT
GLOVE BIOGEL PI IND STRL 8 (GLOVE) ×1 IMPLANT
GLOVE BIOGEL PI IND STRL 9 (GLOVE) ×1 IMPLANT
GLOVE BIOGEL PI INDICATOR 8 (GLOVE) ×1
GLOVE BIOGEL PI INDICATOR 9 (GLOVE) ×1
GOWN STRL REUS W/TWL XL LVL3 (GOWN DISPOSABLE) ×4 IMPLANT
HANDPIECE INTERPULSE COAX TIP (DISPOSABLE) ×1
HOOD PEEL AWAY FLYTE STAYCOOL (MISCELLANEOUS) ×6 IMPLANT
KIT TURNOVER KIT A (KITS) IMPLANT
NEEDLE HYPO 21X1.5 SAFETY (NEEDLE) ×4 IMPLANT
NS IRRIG 1000ML POUR BTL (IV SOLUTION) ×2 IMPLANT
PACK ICE MAXI GEL EZY WRAP (MISCELLANEOUS) ×2 IMPLANT
PACK TOTAL KNEE CUSTOM (KITS) ×2 IMPLANT
PENCIL SMOKE EVACUATOR (MISCELLANEOUS) IMPLANT
PIN DRILL FIX HALF THREAD (BIT) ×2 IMPLANT
PIN STEINMAN FIXATION KNEE (PIN) ×2 IMPLANT
PROTECTOR NERVE ULNAR (MISCELLANEOUS) ×2 IMPLANT
SET HNDPC FAN SPRY TIP SCT (DISPOSABLE) ×1 IMPLANT
SUT VIC AB 1 CTX 36 (SUTURE) ×1
SUT VIC AB 1 CTX36XBRD ANBCTR (SUTURE) ×1 IMPLANT
SUT VIC AB 3-0 CT1 27 (SUTURE) ×3
SUT VIC AB 3-0 CT1 TAPERPNT 27 (SUTURE) ×3 IMPLANT
SYR CONTROL 10ML LL (SYRINGE) ×4 IMPLANT
TIBIAL BASE ATTUNE KNEE SZ9 (Knees) ×2 IMPLANT
TRAY FOLEY MTR SLVR 16FR STAT (SET/KITS/TRAYS/PACK) ×2 IMPLANT
WATER STERILE IRR 1000ML POUR (IV SOLUTION) ×4 IMPLANT
WRAP KNEE MAXI GEL POST OP (GAUZE/BANDAGES/DRESSINGS) ×2 IMPLANT
YANKAUER SUCT BULB TIP 10FT TU (MISCELLANEOUS) ×2 IMPLANT

## 2019-10-22 NOTE — Transfer of Care (Signed)
Immediate Anesthesia Transfer of Care Note  Patient: Dustin Washington  Procedure(s) Performed: Procedure(s): LEFT TOTAL KNEE ARTHROPLASTY (Left)  Patient Location: PACU  Anesthesia Type:Spinal  Level of Consciousness:  sedated, patient cooperative and responds to stimulation  Airway & Oxygen Therapy:Patient Spontanous Breathing and Patient connected to face mask oxgen  Post-op Assessment:  Report given to PACU RN and Post -op Vital signs reviewed and stable  Post vital signs:  Reviewed and stable  Last Vitals:  Vitals:   10/22/19 0630  BP: 119/70  Pulse: 67  Resp: 14  Temp: 37.2 C  SpO2: 99%    Complications: No apparent anesthesia complications

## 2019-10-22 NOTE — Interval H&P Note (Signed)
History and Physical Interval Note:  10/22/2019 6:03 AM  Dustin Washington  has presented today for surgery, with the diagnosis of LEFT KNEE OSTEOARTHRITIS.  The various methods of treatment have been discussed with the patient and family. After consideration of risks, benefits and other options for treatment, the patient has consented to  Procedure(s): LEFT TOTAL KNEE ARTHROPLASTY (Left) as a surgical intervention.  The patient's history has been reviewed, patient examined, no change in status, stable for surgery.  I have reviewed the patient's chart and labs.  Questions were answered to the patient's satisfaction.     Nestor Lewandowsky

## 2019-10-22 NOTE — Discharge Instructions (Signed)

## 2019-10-22 NOTE — Anesthesia Postprocedure Evaluation (Signed)
Anesthesia Post Note  Patient: Dustin Washington  Procedure(s) Performed: LEFT TOTAL KNEE ARTHROPLASTY (Left Knee)     Patient location during evaluation: PACU Anesthesia Type: Spinal Level of consciousness: oriented and awake and alert Pain management: pain level controlled Vital Signs Assessment: post-procedure vital signs reviewed and stable Respiratory status: spontaneous breathing, respiratory function stable and patient connected to nasal cannula oxygen Cardiovascular status: blood pressure returned to baseline and stable Postop Assessment: no headache, no backache and no apparent nausea or vomiting Anesthetic complications: no    Last Vitals:  Vitals:   10/22/19 1000 10/22/19 1020  BP: 109/62 (!) 105/55  Pulse: 73 73  Resp: 14 14  Temp:  (!) 36.4 C  SpO2: 100% 100%    Last Pain:  Vitals:   10/22/19 1105  TempSrc:   PainSc: 0-No pain                 Shelton Silvas

## 2019-10-22 NOTE — Anesthesia Procedure Notes (Signed)
Anesthesia Regional Block: Adductor canal block   Pre-Anesthetic Checklist: ,, timeout performed, Correct Patient, Correct Site, Correct Laterality, Correct Procedure, Correct Position, site marked, Risks and benefits discussed,  Surgical consent,  Pre-op evaluation,  At surgeon's request and post-op pain management  Laterality: Left  Prep: chloraprep       Needles:  Injection technique: Single-shot  Needle Type: Echogenic Stimulator Needle     Needle Length: 9cm  Needle Gauge: 21     Additional Needles:   Procedures:,,,, ultrasound used (permanent image in chart),,,,  Narrative:  Start time: 10/22/2019 6:50 AM End time: 10/22/2019 7:00 AM Injection made incrementally with aspirations every 5 mL.  Performed by: Personally  Anesthesiologist: Shelton Silvas, MD  Additional Notes: Patient tolerated the procedure well. Local anesthetic introduced in an incremental fashion under minimal resistance after negative aspirations. No paresthesias were elicited. After completion of the procedure, no acute issues were identified and patient continued to be monitored by RN.
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
## 2019-10-22 NOTE — Op Note (Signed)
PATIENT ID:      Dustin Washington  MRN:     [id number] DOB/AGE:    [birth year] / 79 y.o.       OPERATIVE REPORT   DATE OF PROCEDURE:  10/22/2019      PREOPERATIVE DIAGNOSIS:   LEFT KNEE OSTEOARTHRITIS      Estimated body mass index is 34.93 kg/m as calculated from the following:   Height as of this encounter: 5\' 9"  (1.753 m).   Weight as of this encounter: 107.3 kg.                                                       POSTOPERATIVE DIAGNOSIS:   LEFT KNEE OSTEOARTHRITIS                                                                       PROCEDURE:  Procedure(s): LEFT TOTAL KNEE ARTHROPLASTY Using DepuyAttune RP implants #8L Femur, #9Tibia, 6 mm Attune RP bearing, 38 Patella    SURGEON:  ASSISTANT:   Nestor Lewandowsky. Tomi Likens   (Present and scrubbed throughout the case, critical for assistance with exposure, retraction, instrumentation, and closure.)        ANESTHESIA: Spinal, 20cc Exparel, 50cc 0.25% Marcaine EBL: 300 cc FLUID REPLACEMENT: 1600 cc crystaloid TOURNIQUET: DRAINS: None TRANEXAMIC ACID: 1gm IV, 2gm topical COMPLICATIONS:  None         INDICATIONS FOR PROCEDURE: The patient has  LEFT KNEE OSTEOARTHRITIS, Var deformities, XR shows bone on bone arthritis, lateral subluxation of tibia. Patient has failed all conservative measures including anti-inflammatory medicines, narcotics, attempts at exercise and weight loss, cortisone injections and viscosupplementation.  Risks and benefits of surgery have been discussed, questions answered.   DESCRIPTION OF PROCEDURE: The patient identified by armband, received  IV antibiotics, in the holding area at [facility]. Patient taken to the operating room, appropriate anesthetic monitors were attached, and Spinal anesthesia was  induced. IV Tranexamic acid was given.Tourniquet applied high to the operative thigh. Lateral post and foot positioner applied to the table, the lower extremity was then prepped and draped in usual  sterile fashion from the toes to the tourniquet. Time-out procedure was performed. [facility]. [facility] PAC, was present and scrubbed throughout the case, critical for assistance with, positioning, exposure, retraction, instrumentation, and closure.The skin and subcutaneous tissue along the incision was injected with 20 cc of a mixture of Exparel and Marcaine solution, using a 20-gauge by 1-1/2 inch needle. We began the operation, with the knee flexed 130 degrees, by making the anterior midline incision starting at handbreadth above the patella going over the patella 1 cm medial to and 4 cm distal to the tibial tubercle. Small bleeders in the skin and the subcutaneous tissue identified and cauterized. Transverse retinaculum was incised and reflected medially and a medial parapatellar arthrotomy was accomplished. the patella was everted and theprepatellar fat pad resected. The superficial medial collateral ligament was then elevated from anterior to posterior along the proximal flare of the tibia and anterior half of the menisci resected. The knee was hyperflexed  exposing bone on bone arthritis. Peripheral and notch osteophytes as well as the cruciate ligaments were then resected. We continued to work our way around posteriorly along the proximal tibia, and externally rotated the tibia subluxing it out from underneath the femur. A McHale PCL retractor was placed through the notch and a lateral Hohmann retractor placed, and we then entered the proximal tibia in line with the Depuy starter drill in line with the axis of the tibia followed by an intramedullary guide rod and 0-degree posterior slope cutting guide. The tibial cutting guide, 4 degree posterior sloped, was pinned into place allowing resection of 5 mm of bone medially and 12 mm of bone laterally. Satisfied with the tibial resection, we then entered the distal femur 2 mm anterior to the PCL origin with the intramedullary guide rod and applied the distal femoral  cutting guide set at 9 mm, with 5 degrees of valgus. This was pinned along the epicondylar axis. At this point, the distal femoral cut was accomplished without difficulty. We then sized for a #8L femoral component and pinned the guide in 3 degrees of external rotation. The chamfer cutting guide was pinned into place. The anterior, posterior, and chamfer cuts were accomplished without difficulty followed by the Attune RP box cutting guide and the box cut. We also removed posterior osteophytes from the posterior femoral condyles. The posterior capsule was injected with Exparel solution. The knee was brought into full extension. We checked our extension gap and fit a 6 mm bearing. Distracting in extension with a lamina spreader,  bleeders in the posterior capsule, Posterior medial and posterior lateral gutter were cauterized.  The transexamic acid-soaked sponge was then placed in the gap of the knee in extension. The knee was flexed 30. The posterior patella cut was accomplished with the 9.5 mm Attune cutting guide, sized for a 109mm dome, and the fixation pegs drilled.The knee was then once again hyperflexed exposing the proximal tibia. We sized for a # 9 tibial base plate, applied the smokestack and the conical reamer followed by the the Delta fin keel punch. We then hammered into place the Attune RP trial femoral component, drilled the lugs, inserted a  6 mm trial bearing, trial patellar button, and took the knee through range of motion from 0-130 degrees. Medial and lateral ligamentous stability was checked. No thumb pressure was required for patellar Tracking. The tourniquet was not used. All trial components were removed, mating surfaces irrigated with pulse lavage, and dried with suction and sponges. 10 cc of the Exparel solution was applied to the cancellus bone of the patella distal femur and proximal tibia.  After waiting 30 seconds, the bony surfaces were again, dried with sponges. A double batch of DePuy HV  cement was mixed and applied to all bony metallic mating surfaces except for the posterior condyles of the femur itself. In order, we hammered into place the tibial tray and removed excess cement, the femoral component and removed excess cement. The final Attune RP bearing was inserted, and the knee brought to full extension with compression. The patellar button was clamped into place, and excess cement removed. The knee was held at 30 flexion with compression, while the cement cured. The wound was irrigated out with normal saline solution pulse lavage. The rest of the Exparel was injected into the parapatellar arthrotomy, subcutaneous tissues, and periosteal tissues. The parapatellar arthrotomy was closed with running #1 Vicryl suture. The subcutaneous tissue with 0 and 2-0 undyed Vicryl suture, and the skin  with running 3-0 SQ vicryl. An Aquacil and Ace wrap were applied. The patient was taken to recovery room without difficulty.   Nestor Lewandowsky 10/22/2019, 8:58 AM

## 2019-10-22 NOTE — Anesthesia Procedure Notes (Signed)
Spinal  Start time: 10/22/2019 7:26 AM End time: 10/22/2019 7:28 AM Staffing Performed: anesthesiologist  Anesthesiologist: Shelton Silvas, MD Preanesthetic Checklist Completed: patient identified, IV checked, site marked, risks and benefits discussed, surgical consent, monitors and equipment checked, pre-op evaluation and timeout performed Spinal Block Patient position: sitting Prep: DuraPrep and site prepped and draped Location: L3-4 Injection technique: single-shot Needle Needle type: Pencan  Needle gauge: 24 G Needle length: 10 cm Needle insertion depth: 10 cm Additional Notes Patient tolerated well. No immediate complications.

## 2019-10-22 NOTE — Anesthesia Preprocedure Evaluation (Signed)
Anesthesia Evaluation  Patient identified by MRN, date of birth, ID band Patient awake    Reviewed: Allergy & Precautions, NPO status , Patient's Chart, lab work & pertinent test results  Airway Mallampati: II  TM Distance: >3 FB Neck ROM: Full    Dental  (+) Partial Upper, Dental Advisory Given   Pulmonary neg pulmonary ROS,    breath sounds clear to auscultation       Cardiovascular hypertension, Pt. on medications + dysrhythmias  Rhythm:Regular Rate:Normal     Neuro/Psych negative neurological ROS  negative psych ROS   GI/Hepatic negative GI ROS, Neg liver ROS,   Endo/Other  Hypothyroidism   Renal/GU negative Renal ROS     Musculoskeletal  (+) Arthritis ,   Abdominal Normal abdominal exam  (+)   Peds  Hematology negative hematology ROS (+)   Anesthesia Other Findings   Reproductive/Obstetrics                             Anesthesia Physical Anesthesia Plan  ASA: II  Anesthesia Plan: Spinal   Post-op Pain Management:  Regional for Post-op pain   Induction: Intravenous  PONV Risk Score and Plan: 2 and Ondansetron, Dexamethasone and Propofol infusion  Airway Management Planned: Natural Airway and Simple Face Mask  Additional Equipment: None  Intra-op Plan:   Post-operative Plan:   Informed Consent: I have reviewed the patients History and Physical, chart, labs and discussed the procedure including the risks, benefits and alternatives for the proposed anesthesia with the patient or authorized representative who has indicated his/her understanding and acceptance.     Dental advisory given  Plan Discussed with: CRNA  Anesthesia Plan Comments:         Anesthesia Quick Evaluation

## 2019-10-23 ENCOUNTER — Encounter: Payer: Self-pay | Admitting: *Deleted

## 2019-10-23 DIAGNOSIS — M1712 Unilateral primary osteoarthritis, left knee: Secondary | ICD-10-CM | POA: Diagnosis not present

## 2019-10-23 LAB — CBC
HCT: 31.8 % — ABNORMAL LOW (ref 39.0–52.0)
Hemoglobin: 10.1 g/dL — ABNORMAL LOW (ref 13.0–17.0)
MCH: 31.2 pg (ref 26.0–34.0)
MCHC: 31.8 g/dL (ref 30.0–36.0)
MCV: 98.1 fL (ref 80.0–100.0)
Platelets: 152 10*3/uL (ref 150–400)
RBC: 3.24 MIL/uL — ABNORMAL LOW (ref 4.22–5.81)
RDW: 13 % (ref 11.5–15.5)
WBC: 7.7 10*3/uL (ref 4.0–10.5)
nRBC: 0 % (ref 0.0–0.2)

## 2019-10-23 LAB — BASIC METABOLIC PANEL
Anion gap: 6 (ref 5–15)
BUN: 10 mg/dL (ref 8–23)
CO2: 24 mmol/L (ref 22–32)
Calcium: 8.4 mg/dL — ABNORMAL LOW (ref 8.9–10.3)
Chloride: 104 mmol/L (ref 98–111)
Creatinine, Ser: 0.96 mg/dL (ref 0.61–1.24)
GFR calc Af Amer: >60 mL/min (ref >60)
GFR calc non Af Amer: >60 mL/min (ref >60)
Glucose, Bld: 138 mg/dL — ABNORMAL HIGH (ref 70–99)
Potassium: 4.4 mmol/L (ref 3.5–5.1)
Sodium: 134 mmol/L — ABNORMAL LOW (ref 135–145)

## 2019-10-23 NOTE — Progress Notes (Signed)
PATIENT ID: Dustin Washington  MRN: [id number]  DOB/AGE:  [birth year] / 77 y.o.  1 Day Post-Op Procedure(s) (LRB): LEFT TOTAL KNEE ARTHROPLASTY (Left)    PROGRESS NOTE Subjective: Patient is alert, oriented, no Nausea, no Vomiting, yes passing gas. Taking PO well. Denies SOB, Chest or Calf Pain. Using Incentive Spirometer, PAS in place. Ambulate 60', Patient reports pain as 2/10 .    Objective: Vital signs in last 24 hours: Vitals:   10/22/19 1738 10/22/19 2106 10/23/19 0057 10/23/19 0459  BP: 122/79 128/72 116/64 127/73  Pulse: 79 79 76 (Abnormal) 59  Resp: 17 16 16 16   Temp:  98 F (36.7 C) 98.4 F (36.9 C) 98.6 F (37 C)  TempSrc:  Oral Oral Oral  SpO2: 100% 100% 99% 100%  Weight:      Height:          Intake/Output from previous day: I/O last 3 completed shifts: In: 2467.7 [P.O.:600; I.V.:1767.7; IV Piggyback:100] Out: 06-27-1970; Blood:60]   Intake/Output this shift: No intake/output data recorded.   LABORATORY DATA: Recent Labs    10/22/19 1735 10/23/19 0318  WBC  --  7.7  HGB  --  10.1*  HCT  --  31.8*  PLT  --  152  NA  --  134*  K  --  4.4  CL  --  104  CO2  --  24  BUN  --  10  CREATININE  --  0.96  GLUCOSE  --  138*  GLUCAP 159*  --   CALCIUM  --  8.4*    Examination: Neurologically intact ABD soft Neurovascular intact Sensation intact distally Intact pulses distally Dorsiflexion/Plantar flexion intact Incision: dressing C/D/I No cellulitis present Compartment soft}  Assessment:   1 Day Post-Op Procedure(s) (LRB): LEFT TOTAL KNEE ARTHROPLASTY (Left) ADDITIONAL DIAGNOSIS: Expected Acute Blood Loss Anemia, HTN, Prostatic hypertrophy, muscular deconditioning  Patient's anticipated LOS is less than 2 midnights, meeting these requirements: - Younger than 55 - Lives within 1 hour of care - Has a competent adult at home to recover with post-op recover - NO history of  - Chronic pain requiring opiods  - Diabetes  - Coronary Artery  Disease  - Heart failure  - Heart attack  - Stroke  - DVT/VTE  - Cardiac arrhythmia  - Respiratory Failure/COPD  - Renal failure  - Anemia  - Advanced Liver disease       Plan: PT/OT WBAT, AROM and PROM  DVT Prophylaxis:  SCDx72hrs, ASA 81 mg BID x 2 weeks DISCHARGE PLAN: Home, We will try to arrange outpatient physical therapy starting tomorrow or the day after assuming the patient goes home today.  Per the insurance adjuster there is no home health physical therapy  And the [facility] region that will accept his insurance. DISCHARGE NEEDS: Walker and 3-in-1 comode seat     Dustin Washington 10/23/2019, 7:47 AM Patient ID: 10/25/2019, male   DOB: 1940/11/20, 79 y.o.   MRN: [id number]

## 2019-10-23 NOTE — TOC Initial Note (Addendum)
Transition of Care [facility]) - Initial/Assessment Note    Patient Details  Name: Dustin Washington MRN: [id number] Date of Birth: [birth year]  Transition of [facility]) CM/SW Contact:    Golda Acre, RN Phone Number: 10/23/2019, 1:03 PM  Clinical Narrative:                 tct-[facility] and premar per patient request can not remember who his case worker with workman comp. tct-generic worker comp cm lisa haS EQUIP NEEDED AND IS GOING TO GO TO [facility]OPT.        Patient Goals and CMS Choice        Expected Discharge Plan and Services                                                Prior Living Arrangements/Services                       Activities of Daily Living Home Assistive Devices/Equipment: Cane (specify quad or straight), Dentures (specify type), Walker (specify type) ADL Screening (condition at time of admission) Patient's cognitive ability adequate to safely complete daily activities?: Yes Is the patient deaf or have difficulty hearing?: No Does the patient have difficulty seeing, even when wearing glasses/contacts?: No Does the patient have difficulty concentrating, remembering, or making decisions?: No Patient able to express need for assistance with ADLs?: Yes Does the patient have difficulty dressing or bathing?: No Independently performs ADLs?: Yes (appropriate for developmental age) Does the patient have difficulty walking or climbing stairs?: No Weakness of Legs: Left Weakness of Arms/Hands: None  Permission Sought/Granted                  Emotional Assessment              Admission diagnosis:  S/P total knee arthroplasty, left [L46.503] Patient Active Problem List   Diagnosis Date Noted  . S/P total knee arthroplasty, left 10/22/2019  . Degenerative arthritis of left knee 10/19/2019  . Chronic insomnia 06/18/2019  . Muscular deconditioning 05/29/2018  . CHF (congestive heart failure) (HCC) 10/01/2013  . Acquired hypothyroidism  06/11/2011  . Benign essential hypertension 06/11/2011  . Other acquired absence of organ 06/11/2011  . Hypertrophy of prostate without urinary obstruction and other lower urinary tract symptoms (LUTS) 06/11/2011   PCP:  Bing Matter, MD Pharmacy:   [facility] [address] [place], [place] - [address] AT [facility] [address] [place] [address] Phone: [phone] Fax: [phone]     Social Determinants of Health (SDOH) Interventions    Readmission Risk Interventions No flowsheet data found.

## 2019-10-23 NOTE — Progress Notes (Signed)
Patient discharged to home w/ son. Given all belongings, instructions. Verbalized understanding of instructions. Escorted to pov via w/c. 

## 2019-10-23 NOTE — Discharge Summary (Signed)
Patient ID: Dustin Washington MRN: [id number] DOB/AGE: 02/02/1941 79 y.o.  Admit date: 10/22/2019 Discharge date: 10/23/2019  Admission Diagnoses:  Principal Problem:   Degenerative arthritis of left knee Active Problems:   S/P total knee arthroplasty, left   Discharge Diagnoses:  Same  Past Medical History:  Diagnosis Date  . Arthritis   . Dysrhythmia    irregular heart beat  . Hypertension     Surgeries: Procedure(s): LEFT TOTAL KNEE ARTHROPLASTY on 10/22/2019   Consultants:   Discharged Condition: Improved  Hospital Course: Dustin Washington is an 79 y.o. male who was admitted 10/22/2019 for operative treatment ofDegenerative arthritis of left knee. Patient has severe unremitting pain that affects sleep, daily activities, and work/hobbies. After pre-op clearance the patient was taken to the operating room on 10/22/2019 and underwent  Procedure(s): LEFT TOTAL KNEE ARTHROPLASTY.    Patient was given perioperative antibiotics:  Anti-infectives (From admission, onward)   Start     Dose/Rate Route Frequency Ordered Stop   10/22/19 0600  ceFAZolin (ANCEF) IVPB 2g/100 mL premix     2 g 200 mL/hr over 30 Minutes Intravenous On call to O.R. 10/22/19 0932 10/22/19 0725   10/22/19 0556  ceFAZolin (ANCEF) 2-4 GM/100ML-% IVPB    Note to Pharmacy: Randa Evens  : cabinet override      10/22/19 0556 10/22/19 0728       Patient was given sequential compression devices, early ambulation, and chemoprophylaxis to prevent DVT.  Patient benefited maximally from hospital stay and there were no complications.    Recent vital signs:  Patient Vitals for the past 24 hrs:  BP Temp Temp src Pulse Resp SpO2  10/23/19 0459 127/73 98.6 F (37 C) Oral (Abnormal) 59 16 100 %  10/23/19 0057 116/64 98.4 F (36.9 C) Oral 76 16 99 %  10/22/19 2106 128/72 98 F (36.7 C) Oral 79 16 100 %  10/22/19 1738 122/79 no documentation no documentation 79 17 100 %  10/22/19 1347 137/80 (Abnormal) 97.4 F  (36.3 C) Oral 80 17 100 %     Recent laboratory studies:  Recent Labs    10/23/19 0318  WBC 7.7  HGB 10.1*  HCT 31.8*  PLT 152  NA 134*  K 4.4  CL 104  CO2 24  BUN 10  CREATININE 0.96  GLUCOSE 138*  CALCIUM 8.4*     Discharge Medications:   Allergies as of 10/23/2019    Allergen Reactions Comment   Codeine Other (See Comments) Mental changes/"felt as if bug were crawling all over body"   Penicillins Itching, Rash Did it involve swelling of the face/tongue/throat, SOB, or low BP? Unknown Did it involve sudden or severe rash/hives, skin peeling, or any reaction on the inside of your mouth or nose? Unknown Did you need to seek medical attention at a hospital or doctor's office? No When did it last happen?More than 25-30 years ago If all above answers are "NO", may proceed with cephalosporin use.      Medication List    Stop taking these medications   acetaminophen 500 MG tablet Commonly known as: TYLENOL     Take these medications   amLODipine 5 MG tablet Commonly known as: NORVASC Take 5 mg by mouth daily.   aspirin EC 81 MG tablet Take 1 tablet (81 mg total) by mouth 2 (two) times daily. What changed: when to take this   atorvastatin 80 MG tablet Commonly known as: LIPITOR Take 80 mg by mouth every evening.   Buprenorphine 7.5  MCG/HR Ptwk Place 1 patch onto the skin See admin instructions. Every 7 days if needed for pain.   calcium carbonate 1500 (600 Ca) MG Tabs tablet Commonly known as: OSCAL Take 600 mg by mouth daily.   COD LIVER PO Take 1 capsule by mouth in the morning and at bedtime.   furosemide 20 MG tablet Commonly known as: LASIX Take 20 mg by mouth daily.   Gralise 600 MG Tabs Generic drug: Gabapentin (Once-Daily) Take 600 mg by mouth in the morning and at bedtime.   HYDROmorphone 2 MG tablet Commonly known as: Dilaudid Take 1 tablet (2 mg total) by mouth every 4 (four) hours as needed for severe pain.   levothyroxine 100 MCG  tablet Commonly known as: SYNTHROID Take 100 mcg by mouth daily before breakfast.   lisinopril 20 MG tablet Commonly known as: ZESTRIL Take 20 mg by mouth daily.   multivitamin with minerals Tabs tablet Take 1 tablet by mouth daily. Centrum Silver   Naphcon-A 0.025-0.3 % ophthalmic solution Generic drug: naphazoline-pheniramine Place 1 drop into both eyes 2 (two) times daily as needed for eye irritation or allergies.   tiZANidine 2 MG tablet Commonly known as: ZANAFLEX Take 1 tablet (2 mg total) by mouth every 6 (six) hours as needed.        Durable Medical Equipment  (From admission, onward)         Start     Ordered   10/22/19 1034  DME Walker rolling  Once    Question:  Patient needs a walker to treat with the following condition  Answer:  Status post total left knee replacement   10/22/19 1033   10/22/19 1034  DME 3 n 1  Once     10/22/19 1033           Discharge Care Instructions  (From admission, onward)         Start     Ordered   10/23/19 0000  Change dressing    Comments: Change dressing Only if drainage exceeds 40% of window on dressing   10/23/19 1308          Diagnostic Studies: DG Chest 2 View  Result Date: 10/18/2019 CLINICAL DATA:  Preop total knee replacement EXAM: CHEST - 2 VIEW COMPARISON:  None. FINDINGS: Cardiomegaly. No confluent opacity or effusion. No overt edema. No acute bony abnormality. IMPRESSION: Cardiomegaly.  No acute cardiopulmonary disease. Electronically Signed   By: Charlett Nose M.D.   On: 10/18/2019 11:25    Disposition: Discharge disposition: 01-Home or Self Care       Discharge Instructions    Call MD / Call 911   Complete by: As directed    If you experience chest pain or shortness of breath, CALL 911 and be transported to the hospital emergency room.  If you develope a fever above 101 F, pus (white drainage) or increased drainage or redness at the wound, or calf pain, call your surgeon's office.   Change  dressing   Complete by: As directed    Change dressing Only if drainage exceeds 40% of window on dressing   Constipation Prevention   Complete by: As directed    Drink plenty of fluids.  Prune juice may be helpful.  You may use a stool softener, such as Colace (over the counter) 100 mg twice a day.  Use MiraLax (over the counter) for constipation as needed.   Diet - low sodium heart healthy   Complete by: As directed  Increase activity slowly as tolerated   Complete by: As directed       Follow-up Information    Gean Birchwood, MD In 2 weeks.   Specialty: Orthopedic Surgery Contact information: [address] [place] [address] [phone]            Signed: Nestor Lewandowsky 10/23/2019, 1:10 PM

## 2019-10-23 NOTE — Progress Notes (Signed)
Physical Therapy Treatment Patient Details Name: Dustin Washington MRN: [id number] DOB: July 24, 1941 Today's Date: 10/23/2019    History of Present Illness s/p L TKA   PMH: HTN    PT Comments    Pt progressing very well; verbally reviewed up/down one very small step (threshold) into home and pt verbalizes technique; amb hallway distance and tolerated TKA HEP with minimal pain. Progressing with ROM as well; pt is quite pleasant and motivated. He is ready to d/c from PT standpoint.   Follow Up Recommendations  Follow surgeon's recommendation for DC plan and follow-up therapies     Equipment Recommendations  Rolling walker with 5" wheels    Recommendations for Other Services       Precautions / Restrictions Precautions Precautions: Fall;Knee Precaution Comments: reviewed no pillow under knee and useof bone foam Restrictions Weight Bearing Restrictions: No Other Position/Activity Restrictions: WBAT    Mobility  Bed Mobility Overal bed mobility: Needs Assistance Bed Mobility: Supine to Sit     Supine to sit: Supervision;Min guard     General bed mobility comments: for safety  Transfers Overall transfer level: Needs assistance Equipment used: Rolling walker (2 wheeled) Transfers: Sit to/from Stand Sit to Stand: Min guard         General transfer comment: cues for hand placement, incr tie, min/guard to rise and transition to RW   Ambulation/Gait Ambulation/Gait assistance: Min guard Gait Distance (Feet): 160 Feet Assistive device: Rolling walker (2 wheeled) Gait Pattern/deviations: Step-to pattern;Step-through pattern;Decreased stance time - left;Decreased stride length     General Gait Details: cues for sequence and RW position from self, stability with incr distance    Stairs             Wheelchair Mobility    Modified Rankin (Stroke Patients Only)       Balance                                            Cognition  Arousal/Alertness: Awake/alert Behavior During Therapy: WFL for tasks assessed/performed Overall Cognitive Status: Within Functional Limits for tasks assessed                                        Exercises Total Joint Exercises Ankle Circles/Pumps: AROM;10 reps;Both Quad Sets: AROM;10 reps;Both Heel Slides: AAROM;Left;5 reps Hip ABduction/ADduction: AROM;AAROM;Left;10 reps Straight Leg Raises: AROM;Left;10 reps Long Arc Quad: AROM;Left;10 reps;Seated Knee Flexion: AAROM;Left;5 reps;Seated Goniometric ROM: grossly 8 to 80 degrees knee flexion     General Comments        Pertinent Vitals/Pain Pain Assessment: 0-10 Pain Score: 4  Pain Location: L knee Pain Descriptors / Indicators: Grimacing;Sore Pain Intervention(s): Limited activity within patient's tolerance;Monitored during session;Premedicated before session;Repositioned    Home Living                      Prior Function            PT Goals (current goals can now be found in the care plan section) Acute Rehab PT Goals Patient Stated Goal: go home today  PT Goal Formulation: With patient Time For Goal Achievement: 10/29/19 Potential to Achieve Goals: Good Progress towards PT goals: Progressing toward goals    Frequency    7X/week      PT Plan  Current plan remains appropriate    Co-evaluation              AM-PAC PT "6 Clicks" Mobility   Outcome Measure  Help needed turning from your back to your side while in a flat bed without using bedrails?: A Little Help needed moving from lying on your back to sitting on the side of a flat bed without using bedrails?: A Little Help needed moving to and from a bed to a chair (including a wheelchair)?: A Little Help needed standing up from a chair using your arms (e.g., wheelchair or bedside chair)?: A Little Help needed to walk in hospital room?: A Little Help needed climbing 3-5 steps with a railing? : A Little 6 Click Score: 18     End of Session Equipment Utilized During Treatment: Gait belt Activity Tolerance: Patient tolerated treatment well Patient left: in chair;with call bell/phone within reach;with chair alarm set Nurse Communication: Mobility status PT Visit Diagnosis: Difficulty in walking, not elsewhere classified (R26.2)     Time: 4949-4473 PT Time Calculation (min) (ACUTE ONLY): 26 min  Charges:  $Gait Training: 8-22 mins $Therapeutic Exercise: 8-22 mins                     Delice Bison, PT   Acute Rehab Dept [facility]): [phone]   10/23/2019    [facility] 10/23/2019, 10:28 AM

## 2020-07-10 IMAGING — DX DG CHEST 2V
2 series · 2 of 2 positions shown · non-contrast
Comparison: None.

CLINICAL DATA: Preop total knee replacement

EXAM:
CHEST - 2 VIEW

[chest pa]
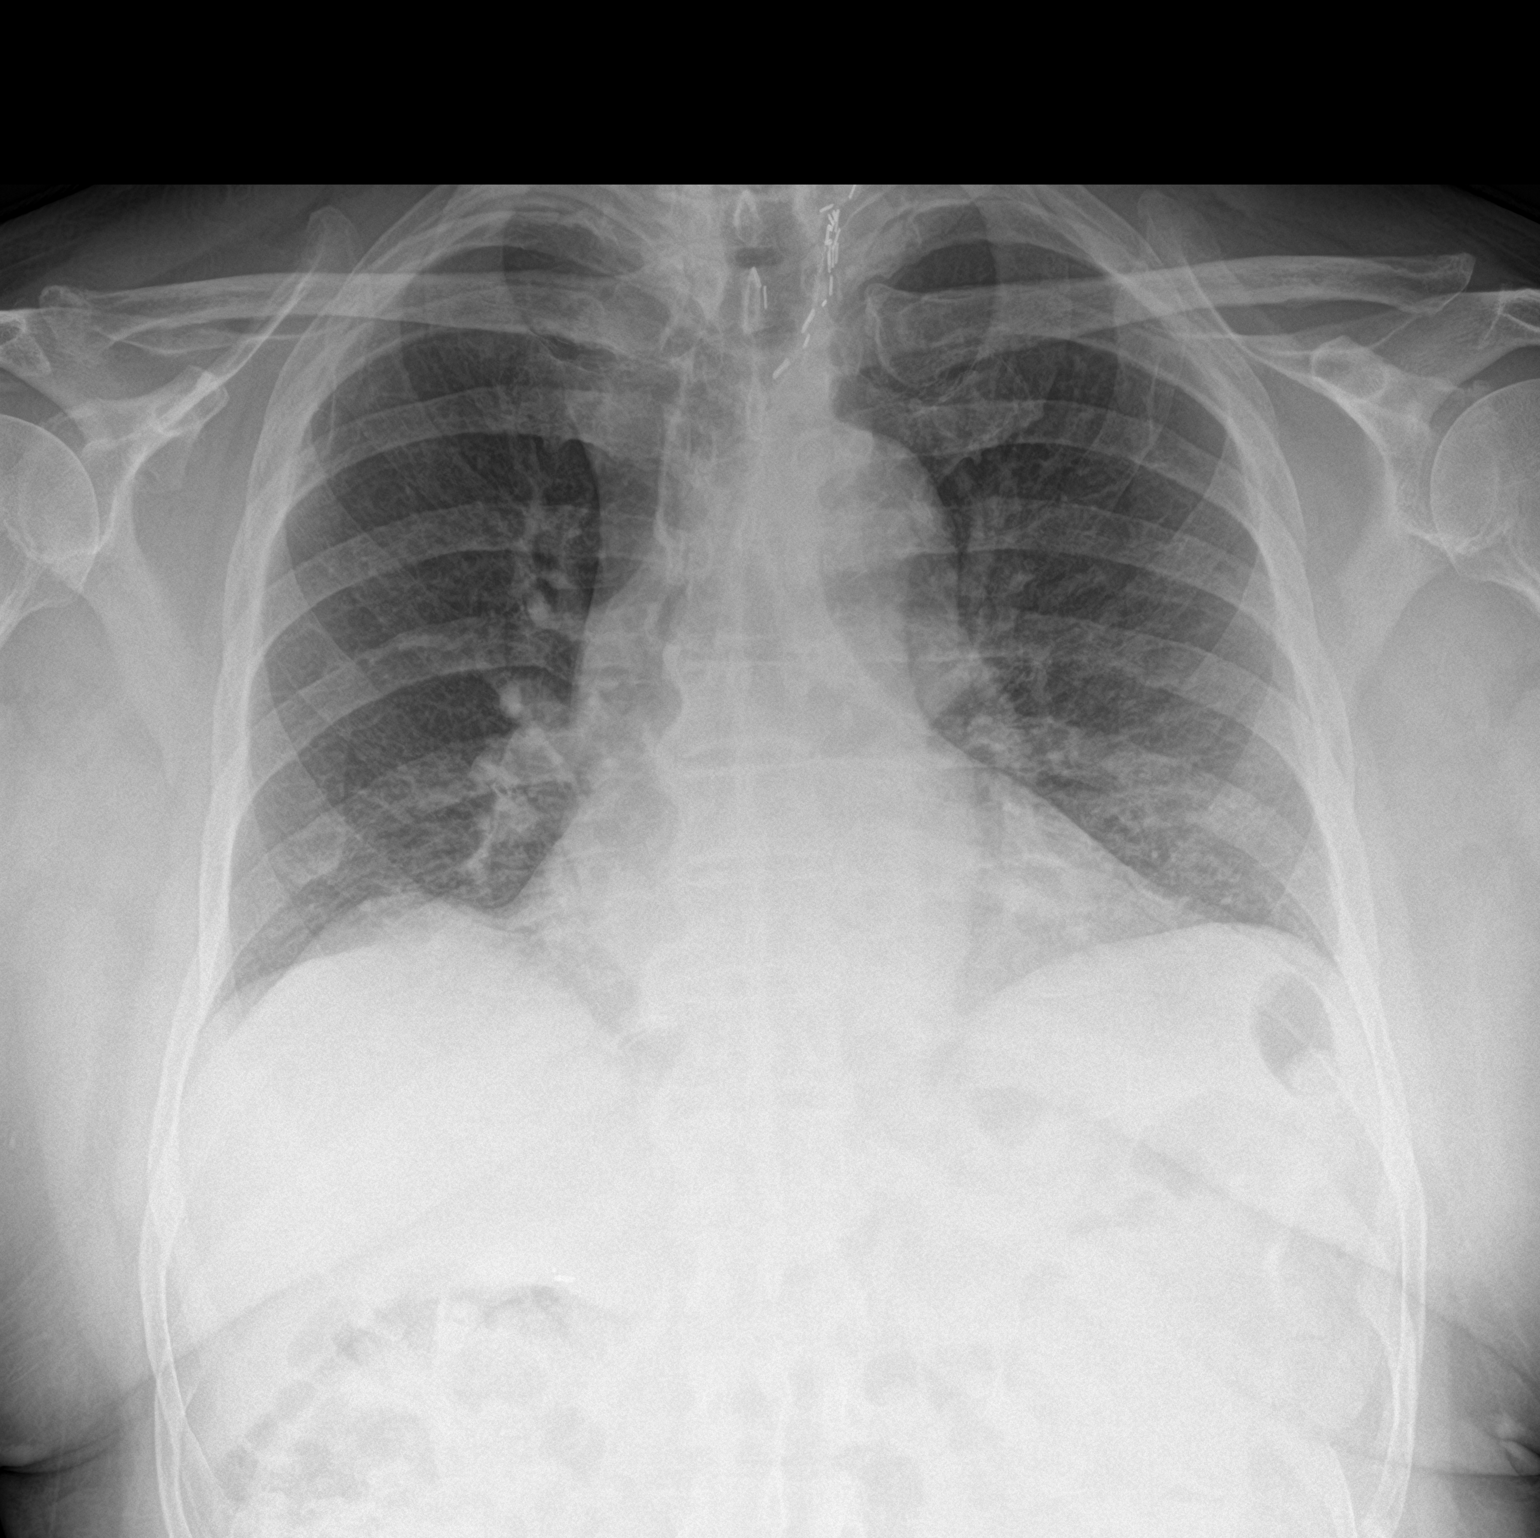

[chest lat]
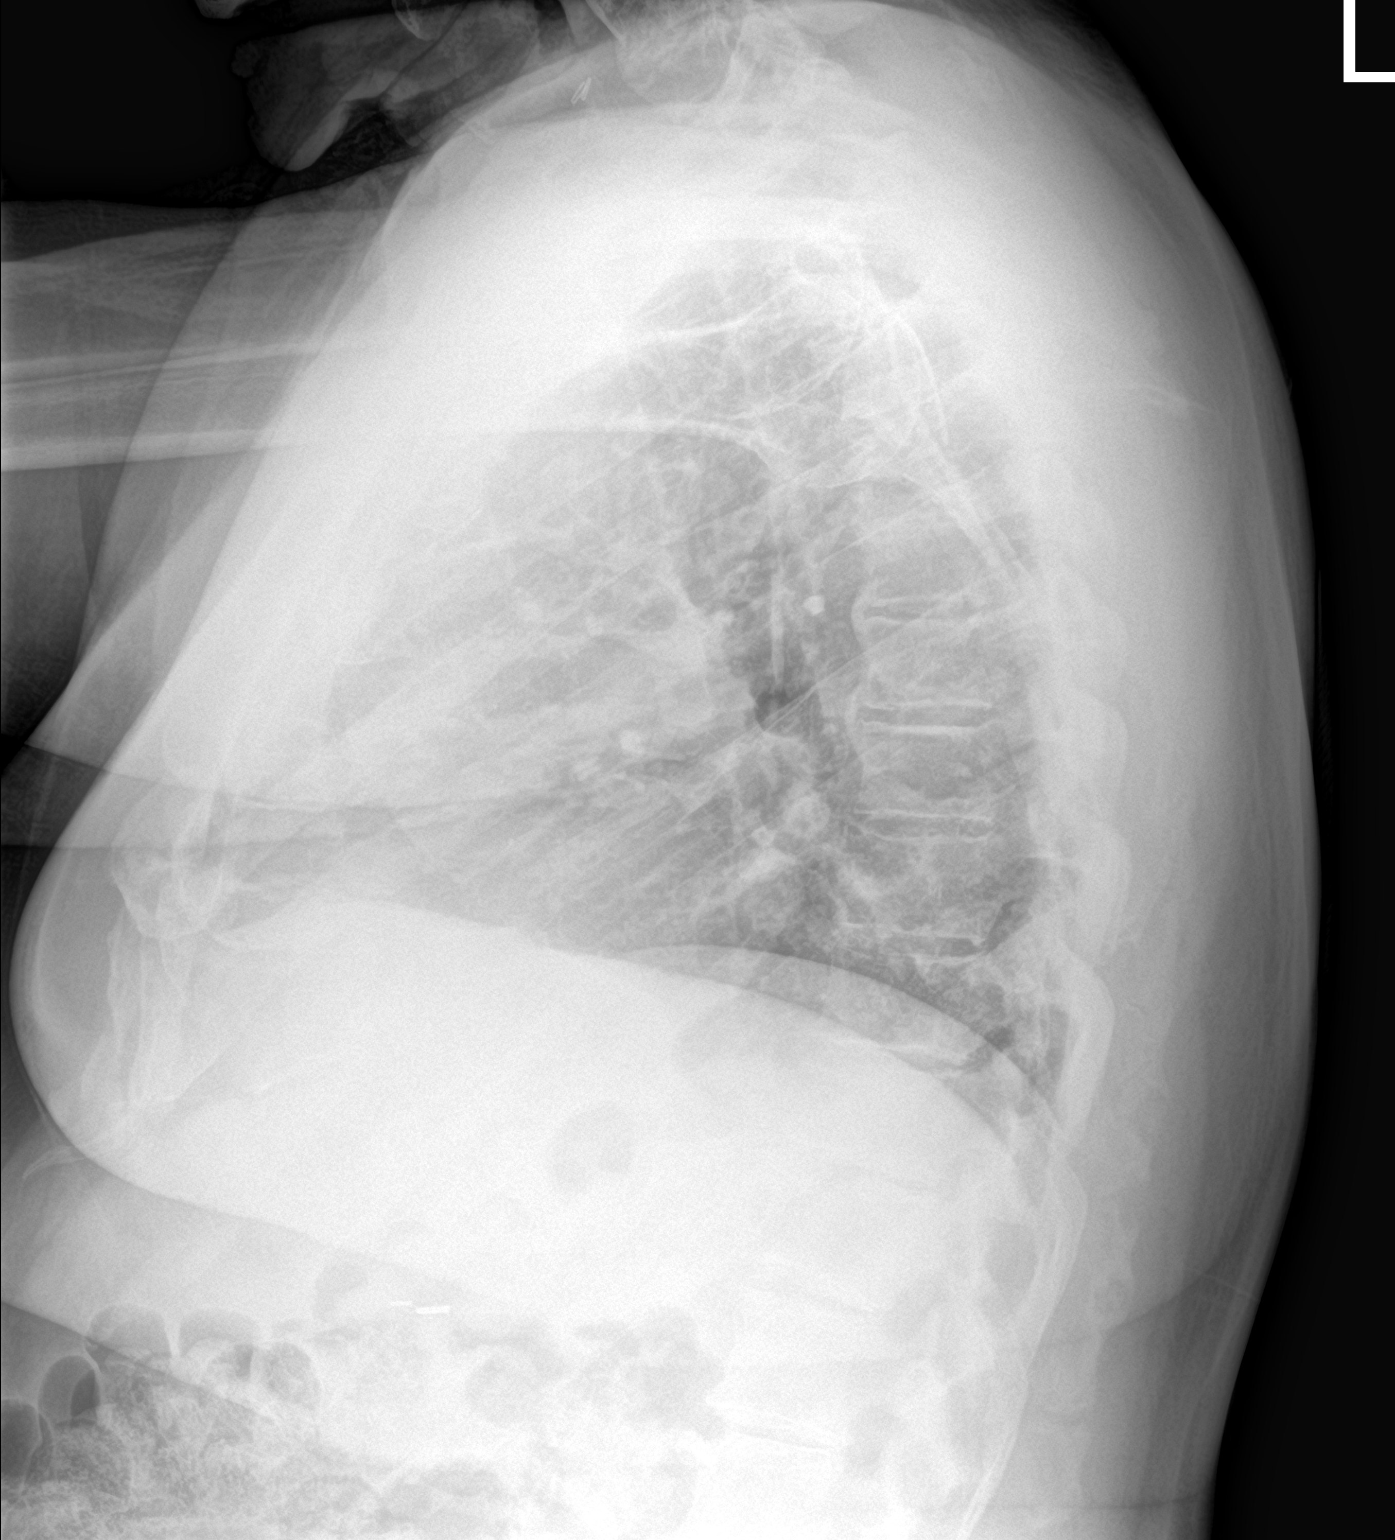

[2 of 2 positions shown; findings below may reference images not displayed]

FINDINGS: Cardiomegaly. No confluent opacity or effusion. No overt edema. No
acute bony abnormality.
IMPRESSION: Cardiomegaly.  No acute cardiopulmonary disease.
